# Patient Record
Sex: Male | Born: 1990 | Race: White | Hispanic: No | Marital: Married | Smoking: Former smoker
Health system: Southern US, Community
[De-identification: ages and names within clinical notes are randomized; demographics above are authoritative.]

## PROBLEM LIST (undated history)

## (undated) DIAGNOSIS — J342 Deviated nasal septum: Secondary | ICD-10-CM

## (undated) DIAGNOSIS — J343 Hypertrophy of nasal turbinates: Secondary | ICD-10-CM

## (undated) DIAGNOSIS — Z87828 Personal history of other (healed) physical injury and trauma: Secondary | ICD-10-CM

## (undated) DIAGNOSIS — R51 Headache: Secondary | ICD-10-CM

## (undated) DIAGNOSIS — F909 Attention-deficit hyperactivity disorder, unspecified type: Secondary | ICD-10-CM

## (undated) DIAGNOSIS — R519 Headache, unspecified: Secondary | ICD-10-CM

## (undated) DIAGNOSIS — F1911 Other psychoactive substance abuse, in remission: Secondary | ICD-10-CM

## (undated) DIAGNOSIS — F40240 Claustrophobia: Secondary | ICD-10-CM

## (undated) HISTORY — PX: INGUINAL HERNIA REPAIR: SHX194

## (undated) HISTORY — DX: Other psychoactive substance abuse, in remission: F19.11

## (undated) HISTORY — PX: TYMPANOSTOMY TUBE PLACEMENT: SHX32

## (undated) HISTORY — PX: TONSILLECTOMY AND ADENOIDECTOMY: SHX28

---
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
## 2013-05-30 ENCOUNTER — Ambulatory Visit (INDEPENDENT_AMBULATORY_CARE_PROVIDER_SITE_OTHER): Payer: BC Managed Care – PPO | Admitting: Internal Medicine

## 2013-05-30 ENCOUNTER — Encounter: Payer: Self-pay | Admitting: Internal Medicine

## 2013-05-30 DIAGNOSIS — M4200 Juvenile osteochondrosis of spine, site unspecified: Secondary | ICD-10-CM

## 2013-05-30 DIAGNOSIS — Z0000 Encounter for general adult medical examination without abnormal findings: Secondary | ICD-10-CM

## 2013-05-30 LAB — LIPID PANEL
Cholesterol: 136 mg/dL (ref 0–200)
Total CHOL/HDL Ratio: 4
Triglycerides: 78 mg/dL (ref 0.0–149.0)
VLDL: 15.6 mg/dL (ref 0.0–40.0)

## 2013-05-30 LAB — CBC WITH DIFFERENTIAL/PLATELET
Basophils Absolute: 0 10*3/uL (ref 0.0–0.1)
Basophils Relative: 0.5 % (ref 0.0–3.0)
Eosinophils Relative: 0.4 % (ref 0.0–5.0)
Lymphs Abs: 1.7 10*3/uL (ref 0.7–4.0)
Monocytes Relative: 7.7 % (ref 3.0–12.0)
Neutro Abs: 5.6 10*3/uL (ref 1.4–7.7)
Neutrophils Relative %: 69.6 % (ref 43.0–77.0)
RBC: 5.2 Mil/uL (ref 4.22–5.81)
WBC: 8 10*3/uL (ref 4.5–10.5)

## 2013-05-30 LAB — COMPREHENSIVE METABOLIC PANEL
AST: 786 U/L — ABNORMAL HIGH (ref 0–37)
GFR: 105.93 mL/min (ref >60.00)
Sodium: 139 mEq/L (ref 135–145)
Total Protein: 7 g/dL (ref 6.0–8.3)

## 2013-05-30 NOTE — Patient Instructions (Addendum)
It is important that you exercise regularly, at least 20 minutes 3 to 4 times per week.  If you develop chest pain or shortness of breath seek  medical attention.  You need to lose weight.  Consider a lower calorie diet and regular exercise.DASH Diet The DASH diet stands for "Dietary Approaches to Stop Hypertension." It is a healthy eating plan that has been shown to reduce high blood pressure (hypertension) in as little as 14 days, while also possibly providing other significant health benefits. These other health benefits include reducing the risk of breast cancer after menopause and reducing the risk of type 2 diabetes, heart disease, colon cancer, and stroke. Health benefits also include weight loss and slowing kidney failure in patients with chronic kidney disease.  DIET GUIDELINES  Limit salt (sodium). Your diet should contain less than 1500 mg of sodium daily.  Limit refined or processed carbohydrates. Your diet should include mostly whole grains. Desserts and added sugars should be used sparingly.  Include small amounts of heart-healthy fats. These types of fats include nuts, oils, and tub margarine. Limit saturated and trans fats. These fats have been shown to be harmful in the body. CHOOSING FOODS  The following food groups are based on a 2000 calorie diet. See your Registered Dietitian for individual calorie needs. Grains and Grain Products (6 to 8 servings daily)  Eat More Often: Whole-wheat bread, brown rice, whole-grain or wheat pasta, quinoa, popcorn without added fat or salt (air popped).  Eat Less Often: White bread, white pasta, white rice, cornbread. Vegetables (4 to 5 servings daily)  Eat More Often: Fresh, frozen, and canned vegetables. Vegetables may be raw, steamed, roasted, or grilled with a minimal amount of fat.  Eat Less Often/Avoid: Creamed or fried vegetables. Vegetables in a cheese sauce. Fruit (4 to 5 servings daily)  Eat More Often: All fresh, canned (in  natural juice), or frozen fruits. Dried fruits without added sugar. One hundred percent fruit juice ( cup [237 mL] daily).  Eat Less Often: Dried fruits with added sugar. Canned fruit in light or heavy syrup. [employer], Fish, and Poultry (2 servings or less daily. One serving is 3 to 4 oz [85-114 g]).  Eat More Often: Ninety percent or leaner ground beef, tenderloin, sirloin. Round cuts of beef, chicken breast, [place] breast. All fish. Grill, bake, or broil your meat. Nothing should be fried.  Eat Less Often/Avoid: Fatty cuts of meat, [place], or chicken leg, thigh, or wing. Fried cuts of meat or fish. Dairy (2 to 3 servings)  Eat More Often: Low-fat or fat-free milk, low-fat plain or light yogurt, reduced-fat or part-skim cheese.  Eat Less Often/Avoid: Milk (whole, 2%).Whole milk yogurt. Full-fat cheeses. Nuts, Seeds, and Legumes (4 to 5 servings per week)  Eat More Often: All without added salt.  Eat Less Often/Avoid: Salted nuts and seeds, canned beans with added salt. Fats and Sweets (limited)  Eat More Often: Vegetable oils, tub margarines without trans fats, sugar-free gelatin. Mayonnaise and salad dressings.  Eat Less Often/Avoid: Coconut oils, palm oils, butter, stick margarine, cream, half and half, cookies, candy, pie. FOR MORE INFORMATION The Dash Diet Eating Plan: www.dashdiet.org Document Released: 10/06/2011 Document Revised: 01/09/2012 Document Reviewed: 10/06/2011 [facility] Patient Information 2014 [place], [place]. Back Exercises Back exercises help treat and prevent back injuries. The goal of back exercises is to increase the strength of your abdominal and back muscles and the flexibility of your back. These exercises should be started when you no longer have back  pain. Back exercises include:  Pelvic Tilt. Lie on your back with your knees bent. Tilt your pelvis until the lower part of your back is against the floor. Hold this position 5 to 10 sec and repeat 5 to 10  times.  Knee to Chest. Pull first 1 knee up against your chest and hold for 20 to 30 seconds, repeat this with the other knee, and then both knees. This may be done with the other leg straight or bent, whichever feels better.  Sit-Ups or Curl-Ups. Bend your knees 90 degrees. Start with tilting your pelvis, and do a partial, slow sit-up, lifting your trunk only 30 to 45 degrees off the floor. Take at least 2 to 3 seconds for each sit-up. Do not do sit-ups with your knees out straight. If partial sit-ups are difficult, simply do the above but with only tightening your abdominal muscles and holding it as directed.  Hip-Lift. Lie on your back with your knees flexed 90 degrees. Push down with your feet and shoulders as you raise your hips a couple inches off the floor; hold for 10 seconds, repeat 5 to 10 times.  Back arches. Lie on your stomach, propping yourself up on bent elbows. Slowly press on your hands, causing an arch in your low back. Repeat 3 to 5 times. Any initial stiffness and discomfort should lessen with repetition over time.  Shoulder-Lifts. Lie face down with arms beside your body. Keep hips and torso pressed to floor as you slowly lift your head and shoulders off the floor. Do not overdo your exercises, especially in the beginning. Exercises may cause you some mild back discomfort which lasts for a few minutes; however, if the pain is more severe, or lasts for more than 15 minutes, do not continue exercises until you see your caregiver. Improvement with exercise therapy for back problems is slow.  See your caregivers for assistance with developing a proper back exercise program. Document Released: 11/24/2004 Document Revised: 01/09/2012 Document Reviewed: 08/18/2011 [facility] Patient Information 2014 [place], [place]. Back Injury Prevention Back injuries can be extremely painful and difficult to heal. After having one back injury, you are much more likely to experience another later on. It  is important to learn how to avoid injuring or re-injuring your back. The following tips can help you to prevent a back injury. PHYSICAL FITNESS  Exercise regularly and try to develop good tone in your abdominal muscles. Your abdominal muscles provide a lot of the support needed by your back.  Do aerobic exercises (walking, jogging, biking, swimming) regularly.  Do exercises that increase balance and strength (tai chi, yoga) regularly. This can decrease your risk of falling and injuring your back.  Stretch before and after exercising.  Maintain a healthy weight. The more you weigh, the more stress is placed on your back. For every pound of weight, 10 times that amount of pressure is placed on the back. DIET  Talk to your caregiver about how much calcium and vitamin D you need per day. These nutrients help to prevent weakening of the bones (osteoporosis). Osteoporosis can cause broken (fractured) bones that lead to back pain.  Include good sources of calcium in your diet, such as dairy products, green, leafy vegetables, and products with calcium added (fortified).  Include good sources of vitamin D in your diet, such as milk and foods that are fortified with vitamin D.  Consider taking a nutritional supplement or a multivitamin if needed.  Stop smoking if you smoke. POSTURE  Sit and stand up straight. Avoid leaning forward when you sit or hunching over when you stand.  Choose chairs with good low back (lumbar) support.  If you work at a desk, sit close to your work so you do not need to lean over. Keep your chin tucked in. Keep your neck drawn back and elbows bent at a right angle. Your arms should look like the letter "L."  Sit high and close to the steering wheel when you drive. Add a lumbar support to your car seat if needed.  Avoid sitting or standing in one position for too long. Take breaks to get up, stretch, and walk around at least once every hour. Take breaks if you are  driving for long periods of time.  Sleep on your side with your knees slightly bent, or sleep on your back with a pillow under your knees. Do not sleep on your stomach. LIFTING, TWISTING, AND REACHING  Avoid heavy lifting, especially repetitive lifting. If you must do heavy lifting:  Stretch before lifting.  Work slowly.  Rest between lifts.  Use carts and dollies to move objects when possible.  Make several small trips instead of carrying 1 heavy load.  Ask for help when you need it.  Ask for help when moving big, awkward objects.  Follow these steps when lifting:  Stand with your feet shoulder-width apart.  Get as close to the object as you can. Do not try to pick up heavy objects that are far from your body.  Use handles or lifting straps if they are available.  Bend at your knees. Squat down, but keep your heels off the floor.  Keep your shoulders pulled back, your chin tucked in, and your back straight.  Lift the object slowly, tightening the muscles in your legs, abdomen, and buttocks. Keep the object as close to the center of your body as possible.  When you put a load down, use these same guidelines in reverse.  Do not:  Lift the object above your waist.  Twist at the waist while lifting or carrying a load. Move your feet if you need to turn, not your waist.  Bend over without bending at your knees.  Avoid reaching over your head, across a table, or for an object on a high surface. OTHER TIPS  Avoid wet floors and keep sidewalks clear of ice to prevent falls.  Do not sleep on a mattress that is too soft or too hard.  Keep items that are used frequently within easy reach.  Put heavier objects on shelves at waist level and lighter objects on lower or higher shelves.  Find ways to decrease your stress, such as exercise, massage, or relaxation techniques. Stress can build up in your muscles. Tense muscles are more vulnerable to injury.  Seek treatment for  depression or anxiety if needed. These conditions can increase your risk of developing back pain. SEEK MEDICAL CARE IF:  You injure your back.  You have questions about diet, exercise, or other ways to prevent back injuries. MAKE SURE YOU:  Understand these instructions.  Will watch your condition.  Will get help right away if you are not doing well or get worse. Document Released: 11/24/2004 Document Revised: 01/09/2012 Document Reviewed: 11/28/2011 [facility] Patient Information 2014 [place], [place].  Smoking tobacco is very bad for your health. You should stop smoking immediately.

## 2013-05-30 NOTE — Progress Notes (Signed)
Subjective:    Patient ID: Ricardo Curry, male    DOB: 26-Oct-1991, 22 y.o.   MRN: [id number]  HPI  22 year old patient who is seen today to establish with our practice. He enjoys excellent health. Past medical history is remarkable for a hospitalization in 2007 for a left frontal epidural hematoma that was treated conservatively. Surgical history also includes a hernia repair in 1995 and a tonsillectomy in 1998. Past medical history is remarkable also for a history of ADHD exogenous obesity as well as Scheuermann's disease (juvenile epiphysitis)  Social history resident of medicine smokes a 2 or 3 cigarettes per day. 1-1/2 years of college. Presently works as a Estate agent. Single  Family history both parents have diabetes. Father age 62 status post cholecystectomy. Mother age 63 fibromyalgia. One brother is well. 3/2 sisters are well 1 I plan a heart attack at age 36. One grandfather status post bypass surgery date 69    Review of Systems  Constitutional: Negative for fever, chills, activity change, appetite change and fatigue.  HENT: Negative for hearing loss, ear pain, congestion, rhinorrhea, sneezing, mouth sores, trouble swallowing, neck pain, neck stiffness, dental problem, voice change, sinus pressure and tinnitus.   Eyes: Negative for photophobia, pain, redness and visual disturbance.  Respiratory: Negative for apnea, cough, choking, chest tightness, shortness of breath and wheezing.   Cardiovascular: Negative for chest pain, palpitations and leg swelling.  Gastrointestinal: Negative for nausea, vomiting, abdominal pain, diarrhea, constipation, blood in stool, abdominal distention, anal bleeding and rectal pain.  Genitourinary: Negative for dysuria, urgency, frequency, hematuria, flank pain, decreased urine volume, discharge, penile swelling, scrotal swelling, difficulty urinating, genital sores and testicular pain.  Musculoskeletal: Positive for back pain. Negative for  myalgias, joint swelling, arthralgias and gait problem.  Skin: Negative for color change, rash and wound.  Neurological: Negative for dizziness, tremors, seizures, syncope, facial asymmetry, speech difficulty, weakness, light-headedness, numbness and headaches.  Hematological: Negative for adenopathy. Does not bruise/bleed easily.  Psychiatric/Behavioral: Negative for suicidal ideas, hallucinations, behavioral problems, confusion, sleep disturbance, self-injury, dysphoric mood, decreased concentration and agitation. The patient is not nervous/anxious.        Objective:   Physical Exam  Constitutional: He appears well-developed and well-nourished.  HENT:  Head: Normocephalic and atraumatic.  Right Ear: External ear normal.  Left Ear: External ear normal.  Nose: Nose normal.  Mouth/Throat: Oropharynx is clear and moist.  Eyes: Conjunctivae and EOM are normal. Pupils are equal, round, and reactive to light. No scleral icterus.  Neck: Normal range of motion. Neck supple. No JVD present. No thyromegaly present.  Cardiovascular: Regular rhythm, normal heart sounds and intact distal pulses.  Exam reveals no gallop and no friction rub.   No murmur heard. Pulmonary/Chest: Effort normal and breath sounds normal. He exhibits no tenderness.  Abdominal: Soft. Bowel sounds are normal. He exhibits no distension and no mass. There is no tenderness.  Genitourinary: Prostate normal and penis normal.  Musculoskeletal: Normal range of motion. He exhibits no edema and no tenderness.  Lymphadenopathy:    He has no cervical adenopathy.  Neurological: He is alert. He has normal reflexes. No cranial nerve deficit. Coordination normal.  Skin: Skin is warm and dry. No rash noted.  Psychiatric: He has a normal mood and affect. His behavior is normal.          Assessment & Plan:   Preventive health exam Exogenous obesity Scheuermann's disease  Weight loss exercise regimen encouraged. Back exercises and  strengthening information dispensed Laboratory update  will be reviewed

## 2014-06-02 ENCOUNTER — Emergency Department (HOSPITAL_COMMUNITY)
Admission: EM | Admit: 2014-06-02 | Discharge: 2014-06-02 | Disposition: A | Discharge status: Home / Self Care | Payer: BC Managed Care – PPO | Attending: Emergency Medicine | Admitting: Emergency Medicine

## 2014-06-02 ENCOUNTER — Encounter (HOSPITAL_COMMUNITY): Payer: Self-pay | Admitting: Emergency Medicine

## 2014-06-02 DIAGNOSIS — Z8619 Personal history of other infectious and parasitic diseases: Secondary | ICD-10-CM | POA: Insufficient documentation

## 2014-06-02 DIAGNOSIS — Z87820 Personal history of traumatic brain injury: Secondary | ICD-10-CM | POA: Insufficient documentation

## 2014-06-02 DIAGNOSIS — F1920 Other psychoactive substance dependence, uncomplicated: Secondary | ICD-10-CM | POA: Insufficient documentation

## 2014-06-02 DIAGNOSIS — F1120 Opioid dependence, uncomplicated: Secondary | ICD-10-CM

## 2014-06-02 DIAGNOSIS — F17200 Nicotine dependence, unspecified, uncomplicated: Secondary | ICD-10-CM | POA: Insufficient documentation

## 2014-06-02 MED ORDER — LOPERAMIDE HCL 2 MG PO CAPS: 2.0000 mg | ORAL_CAPSULE | Freq: Four times a day (QID) | ORAL | Status: DC | PRN

## 2014-06-02 NOTE — Discharge Instructions (Signed)
Read the information below.  Use the prescribed medication as directed.  Please discuss all new medications with your pharmacist.  You may return to the Emergency Department at any time for worsening condition or any new symptoms that concern you.  If you develop uncontrolled pain, uncontrolled vomiting, or are unable to tolerate fluids by mouth, return to the ER for a recheck.     Opioid Use Disorder Opioid use disorder is a mental disorder. It is the continued nonmedical use of opioids in spite of risks to health and well-being. Misused opioids include the street drug heroin. They also include pain medicines such as morphine, hydrocodone, oxycodone, and fentanyl. Opioids are very addictive. People who misuse opioids get an exaggerated feeling of well-being. Opioid use disorder often disrupts activities at home, work, or school. It may cause mental or physical problems.  A family history of opioid use disorder puts you at higher risk of it. People with opioid use disorder often misuse other drugs or have mental illness such as depression, posttraumatic stress disorder, or antisocial personality disorder. They also are at risk of suicide and death from overdose. SIGNS AND SYMPTOMS  Signs and symptoms of opioid use disorder include:  Use of opioids in larger amounts or over a longer period than intended.  Unsuccessful attempts to cut down or control opioid use.  A lot of time spent obtaining, using, or recovering from the effects of opioids.  A strong desire or urge to use opioids (craving).  Continued use of opioids in spite of major problems at work, school, or home because of use.  Continued use of opioids in spite of relationship problems because of use.  Giving up or cutting down on important life activities because of opioid use.  Use of opioids over and over in situations when it is physically hazardous, such as driving a car.  Continued use of opioids in spite of a physical problem  that is likely related to use. Physical problems can include:  Severe constipation.  Poor nutrition.  Infertility.  Tuberculosis.  Aspiration pneumonia.  Infections such as human immunodeficiency virus (HIV) and hepatitis (from injecting opioids).  Continued use of opioids in spite of a mental problem that is likely related to use. Mental problems can include:  Depression.  Anxiety.  Hallucinations.  Sleep problems.  Loss of sexual function.  Need to use more and more opioids to get the same effect, or lessened effect over time with use of the same amount (tolerance).  Having withdrawal symptoms when opioid use is stopped, or using opioids to reduce or avoid withdrawal symptoms. Withdrawal symptoms include:  Depressed, anxious, or irritable mood.  Nausea, vomiting, diarrhea, or intestinal cramping.  Muscle aches or spasms.  Excessive tearing or runny nose.  Dilated pupils, sweating, or hairs standing on end.  Yawning.  Fever, raised blood pressure, or fast pulse.  Restlessness or trouble sleeping. This does not apply to people taking opioids for medical reasons only. DIAGNOSIS Opioid use disorder is diagnosed by your health care provider. You may be asked questions about your opioid use and and how it affects your life. A physical exam may be done. A drug screen may be ordered. You may be referred to a mental health professional. The diagnosis of opioid use disorder requires at least two symptoms within 12 months. The type of opioid use disorder you have depends on the number of signs and symptoms you have. The type may be:  Mild. Two or three signs and symptoms.  Moderate. Four or five signs and symptoms.   Severe. Six or more signs and symptoms. TREATMENT  Treatment is usually provided by mental health professionals with training in substance use disorders.The following options are available:  Detoxification.This is the first step in treatment for  withdrawal. It is medically supervised withdrawal with the use of medicines. These medicines lessen withdrawal symptoms. They also raise the chance of becoming opioid free.  Counseling, also known as talk therapy. Talk therapy addresses the reasons you use opioids. It also addresses ways to keep you from using again (relapse). The goals of talk therapy are to avoid relapse by:  Identifying and avoiding triggers for use.  Finding healthy ways to cope with stress.  Learning how to handle cravings.  Support groups. Support groups provide emotional support, advice, and guidance.  A medicine that blocks opioid receptors in your brain. This medicine can reduce opioid cravings that lead to relapse. This medicine also blocks the desired opioid effect when relapse occurs.  Opioids that are taken by mouth in place of the misused opioid (opioid maintenance treatment). These medicines satisfy cravings but are safer than commonly misused opioids. This often is the best option for people who continue to relapse with other treatments. HOME CARE INSTRUCTIONS   Take medicines only as directed by your health care provider.  Check with your health care provider before starting new medicines.  Keep all follow-up visits as directed by your health care provider. SEEK MEDICAL CARE IF:  You are not able to take your medicines as directed.  Your symptoms get worse. SEEK IMMEDIATE MEDICAL CARE IF:  You have serious thoughts about hurting yourself or others.  You may have taken an overdose of opioids. FOR MORE INFORMATION  National Institute on Drug Abuse: http://www.price-smith.com/www.drugabuse.gov  Substance Abuse and Mental Health Services Administration: SkateOasis.com.ptwww.samhsa.gov Document Released: 08/14/2007 Document Revised: 03/03/2014 Document Reviewed: 10/30/2013 [facility] Patient Information 2015 [place]. This information is not intended to replace advice given to you by your health care provider. Make sure you discuss any  questions you have with your health care provider.  Opioid Withdrawal Opioids are a group of narcotic drugs. They include the street drug heroin. They also include pain medicines, such as morphine, hydrocodone, oxycodone, and fentanyl. Opioid withdrawal is a group of characteristic physical and mental signs and symptoms. It typically occurs if you have been using opioids daily for several weeks or longer and stop using or rapidly decrease use. Opioid withdrawal can also occur if you have used opioids daily for a long time and are given a medicine to block the effect.  SIGNS AND SYMPTOMS Opioid withdrawal includes three or more of the following symptoms:   Depressed, anxious, or irritable mood.  Nausea or vomiting.  Muscle aches or spasms.   Watery eyes.   Runny nose.  Dilated pupils, sweating, or hairs standing on end.  Diarrhea or intestinal cramping.  Yawning.   Fever.  Increased blood pressure.  Fast pulse.  Restlessness or trouble sleeping. These signs and symptoms occur within several hours of stopping or reducing short-acting opioids, such as heroin. They can occur within 3 days of stopping or reducing long-acting opioids, such as methadone. Withdrawal begins within minutes of receiving a drug that blocks the effects of opioids, such as naltrexone or naloxone. DIAGNOSIS  Opioid use disorder is diagnosed by your health care provider. You will be asked about your symptoms, drug and alcohol use, medical history, and use of medicines. A physical exam may be done.  Lab tests may be ordered. Your health care provider may have you see a mental health professional.  TREATMENT  The treatment for opioid withdrawal is usually provided by medical doctors with special training in substance use disorders (addiction specialists). The following medicines may be included in treatment:  Opioids given in place of the abused opioid. They turn on opioid receptors in the brain and lessen or  prevent withdrawal symptoms. They are gradually decreased (opioid substitution and taper).  Non-opioids that can lessen certain opioid withdrawal symptoms. They may be used alone or with opioid substitution and taper. Successful long-term recovery usually requires medicine, counseling, and group support. HOME CARE INSTRUCTIONS   Take medicines only as directed by your health care provider.  Check with your health care provider before starting new medicines.  Keep all follow-up visits as directed by your health care provider. SEEK MEDICAL CARE IF:  You are not able to take your medicines as directed.  Your symptoms get worse.  You relapse. SEEK IMMEDIATE MEDICAL CARE IF:  You have serious thoughts about hurting yourself or others.  You have a seizure.  You lose consciousness. Document Released: 10/20/2003 Document Revised: 03/03/2014 Document Reviewed: 10/30/2013 [facility] Patient Information 2015 [place], [place]. This information is not intended to replace advice given to you by your health care provider. Make sure you discuss any questions you have with your health care provider.

## 2014-06-02 NOTE — ED Notes (Addendum)
Pt requesting detox from opiates, last use yesterday. States they have [facility] to [facility] and pt should have bed by wed or thurs.

## 2014-06-02 NOTE — BHH Counselor (Signed)
[facility]-C. Writer called [facility] and spoke w/ Chrissie NoaWilliam in admissions. Chrissie NoaWilliam states there will be a bed available for pt either this wed or thurs. Chrissie NoaWilliam says he has spoken with pt's father. Chrissie NoaWilliam reports that if pt not admitted to ED, then pt doesn't need to bring labs for [facility]. Writer explained that ED doesn't have opiate detox protocol. Chrissie NoaWilliam asked if pt could be put on some of type of meds for detox. Writer called Trixie DredgeEmily West who will tell pt of writer's TC to Chrissie NoaWilliam. Pt is to be d/c home and to stay in contact with [facility] re: bed status.  Evette Cristalaroline Paige Myia Bergh, [place] Assessment Counselor

## 2014-06-02 NOTE — ED Notes (Signed)
Bed: [facility] Expected date:  Expected time:  Means of arrival:  Comments: 

## 2014-06-02 NOTE — ED Notes (Signed)
Pt reports "I am feeling pretty good right now". [facility] instructed to return to ED if he needs to. Pt walked to d/c with one prescrip.
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
## 2014-06-03 NOTE — ED Provider Notes (Signed)
Medical screening examination/treatment/procedure(s) were performed by non-physician practitioner and as supervising physician I was immediately available for consultation/collaboration.   EKG Interpretation None        Shanna CiscoMegan E Docherty, MD 06/03/14 [phone]

## 2014-11-20 ENCOUNTER — Encounter: Payer: Self-pay | Admitting: Podiatrist

## 2014-11-20 ENCOUNTER — Ambulatory Visit (INDEPENDENT_AMBULATORY_CARE_PROVIDER_SITE_OTHER): Payer: BLUE CROSS/BLUE SHIELD | Admitting: Podiatrist

## 2014-11-20 VITALS — BP 142/97 | HR 103 | Resp 16

## 2014-11-20 DIAGNOSIS — B351 Tinea unguium: Secondary | ICD-10-CM

## 2014-11-20 MED ORDER — EFINACONAZOLE 10 % EX SOLN: 1.0000 [drp] | Freq: Every day | CUTANEOUS | Status: DC

## 2014-11-20 NOTE — Progress Notes (Signed)
   Subjective:    Patient ID: Ricardo Curry, male    DOB: 1990-12-13, 24 y.o.   MRN: [id number]  HPI Comments: "I have a toe I wanted checked"  Patient c/o aching 1st toe left for a few months. He ripped the nail off and then ran over it with the forklift at work. The nail is now discolored and thick. He did take a round of antibiotics initially.  Toe Pain       Review of Systems  Skin:       Change in nail   All other systems reviewed and are negative.      Objective:   Physical Exam Patient is awake, alert, and oriented x 3.  In no acute distress.  Vascular status is intact with palpable pedal pulses at 2/4 DP and PT bilateral and capillary refill time within normal limits. Neurological sensation is also intact bilaterally via Semmes Weinstein monofilament at 5/5 sites. Light touch, vibratory sensation, Achilles tendon reflex is intact. Dermatological exam reveals skin color, turger and texture as normal. No open lesions present.  Musculature intact with dorsiflexion, plantarflexion, inversion, eversion.  Left hallux nail is thick, discolored, dystrophic and mycotic in appearance. Subungual debris is also present.  Pain with direct pressure is also noted. No sign of ingrowing nail deformity is noted. No redness, swelling, drainage is noted.     Assessment & Plan:  Onychomycosis left hallux nail versus dystrophy secondary to a forklift injury in the past  Plan: Recommended taking a sample of the nail to identify a pathogen if there is one present. Discussed that this could be from an injury to the nailbed itself. Discussed oral versus topical therapies versus removal of the nail itself. We'll wait on the culture report before deciding on treatment. We'll notify him of the reculture is to becomes available.

## 2014-11-20 NOTE — Patient Instructions (Signed)
onychomycosis Nail A fungal infection of the nail (tinea unguium/onychomycosis) is common. It is common as the visible part of the nail is composed of dead cells which have no blood supply to help prevent infection. It occurs because fungi are everywhere and will pick any opportunity to grow on any dead material. Because nails are very slow growing they require up to 2 years of treatment with anti-fungal medications. The entire nail back to the base is infected. This includes approximately  of the nail which you cannot see. If your caregiver has prescribed a medication by mouth, take it every day and as directed. No progress will be seen for at least 6 to 9 months. Do not be disappointed! Because fungi live on dead cells with little or no exposure to blood supply, medication delivery to the infection is slow; thus the cure is slow. It is also why you can observe no progress in the first 6 months. The nail becoming cured is the base of the nail, as it has the blood supply. Topical medication such as creams and ointments are usually not effective. Important in successful treatment of nail fungus is closely following the medication regimen that your doctor prescribes. Sometimes you and your caregiver may elect to speed up this process by surgical removal of all the nails. Even this may still require 6 to 9 months of additional oral medications. See your caregiver as directed. Remember there will be no visible improvement for at least 6 months. See your caregiver sooner if other signs of infection (redness and swelling) develop. Document Released: 10/14/2000 Document Revised: 01/09/2012 Document Reviewed: 12/23/2008 ExitCare Patient Information 2015 [employer]. This information is not intended to replace advice given to you by your health care provider. Make sure you discuss any questions you have with your health care provider.  

## 2014-12-15 ENCOUNTER — Encounter: Payer: Self-pay | Admitting: Podiatrist

## 2014-12-15 ENCOUNTER — Telehealth: Payer: Self-pay | Admitting: *Deleted

## 2014-12-15 NOTE — Telephone Encounter (Signed)
-----   Message from Delories HeinzKathryn P Egerton, DPM sent at 12/10/2014  5:29 PM EST ----- Regarding: toenail culture result Toenail culture is negative--it usually means the thickness is from a trauma but  we can try a round of oral lamisil if he wants to see if that will help as sometimes there is fungus present, but the lab test doesn't always detect it.  Or we can remove the toenail completely and permanently if it is bothersome.  Happy to do either or none.    Thanks!

## 2014-12-15 NOTE — Telephone Encounter (Signed)
I called and left the patient a message to call me back. 

## 2015-01-02 NOTE — Telephone Encounter (Signed)
I left another message for the patient to give me a call.

## 2015-05-08 ENCOUNTER — Ambulatory Visit (INDEPENDENT_AMBULATORY_CARE_PROVIDER_SITE_OTHER): Payer: BLUE CROSS/BLUE SHIELD | Admitting: Podiatry

## 2015-05-08 ENCOUNTER — Encounter: Payer: Self-pay | Admitting: Podiatry

## 2015-05-08 ENCOUNTER — Ambulatory Visit (INDEPENDENT_AMBULATORY_CARE_PROVIDER_SITE_OTHER): Payer: BLUE CROSS/BLUE SHIELD

## 2015-05-08 VITALS — BP 145/96 | HR 92 | Resp 12

## 2015-05-08 DIAGNOSIS — B351 Tinea unguium: Secondary | ICD-10-CM

## 2015-05-08 DIAGNOSIS — R52 Pain, unspecified: Secondary | ICD-10-CM

## 2015-05-08 DIAGNOSIS — M722 Plantar fascial fibromatosis: Secondary | ICD-10-CM

## 2015-05-08 MED ORDER — DICLOFENAC SODIUM 75 MG PO TBEC: 75.0000 mg | DELAYED_RELEASE_TABLET | Freq: Two times a day (BID) | ORAL | Status: DC

## 2015-05-08 MED ORDER — TRIAMCINOLONE ACETONIDE 10 MG/ML IJ SUSP
10.0000 mg | Freq: Once | INTRAMUSCULAR | Status: AC
  Administered 2015-05-08: 10 mg

## 2015-05-08 NOTE — Patient Instructions (Signed)

## 2015-05-08 NOTE — Progress Notes (Signed)
   Subjective:    Patient ID: Ricardo Curry, male    DOB: 09-29-1991, 24 y.o.   MRN: [id number]  HPI  PT STATED LT FOOT ARCH IS BEEN PAINFUL FOR 4 MONTHS. THE FOOT IS GETTING WORSE ESPECIALLY WHEN SITTING AND THEN STAND UP IT HURTS. TRIED ICE IT HELP SOME.  Review of Systems  Musculoskeletal: Positive for gait problem.       Objective:   Physical Exam        Assessment & Plan:

## 2015-05-09 NOTE — Progress Notes (Signed)
Subjective:     Patient ID: Ricardo Curry, male   DOB: 1991/02/10, 24 y.o.   MRN: [id number]  HPI patient states I been having a lot of pain in my mid arch left foot for the last 4 months. It's been getting gradually worse and is now to the point where it hard for me to work or walk   Review of Systems     Objective:   Physical Exam Neurovascular status intact muscle strength adequate range of motion within normal limits. Patient's noted to have exquisite discomfort in the mid arch region left with inflammation of the medial band with no indication of tear or other deformity    Assessment:     Inflammatory fasciitis left mid arch area moderate depression of the arch noted    Plan:     H&P and x-ray reviewed with patient. Injected the mid arch area 3 mg Kenalog 5 mg Xylocaine and instructed on physical therapy and dispensed fascial brace. Discussed orthotics and reappoint one week to review

## 2015-05-15 ENCOUNTER — Ambulatory Visit: Payer: BLUE CROSS/BLUE SHIELD | Admitting: Podiatry

## 2016-12-29 DIAGNOSIS — J310 Chronic rhinitis: Secondary | ICD-10-CM | POA: Insufficient documentation

## 2016-12-29 DIAGNOSIS — R430 Anosmia: Secondary | ICD-10-CM | POA: Insufficient documentation

## 2017-02-17 ENCOUNTER — Other Ambulatory Visit: Payer: Self-pay | Admitting: Otolaryngology

## 2017-02-17 DIAGNOSIS — J3489 Other specified disorders of nose and nasal sinuses: Secondary | ICD-10-CM

## 2017-02-17 DIAGNOSIS — R430 Anosmia: Secondary | ICD-10-CM

## 2017-02-17 DIAGNOSIS — J310 Chronic rhinitis: Secondary | ICD-10-CM

## 2017-05-29 ENCOUNTER — Ambulatory Visit: Payer: BLUE CROSS/BLUE SHIELD | Admitting: Podiatry

## 2017-06-02 DIAGNOSIS — F900 Attention-deficit hyperactivity disorder, predominantly inattentive type: Secondary | ICD-10-CM | POA: Diagnosis not present

## 2017-06-02 DIAGNOSIS — Z6841 Body Mass Index (BMI) 40.0 and over, adult: Secondary | ICD-10-CM | POA: Diagnosis not present

## 2017-08-31 DIAGNOSIS — J342 Deviated nasal septum: Secondary | ICD-10-CM

## 2017-08-31 DIAGNOSIS — J343 Hypertrophy of nasal turbinates: Secondary | ICD-10-CM

## 2017-08-31 HISTORY — DX: Hypertrophy of nasal turbinates: J34.3

## 2017-08-31 HISTORY — DX: Deviated nasal septum: J34.2

## 2017-09-08 NOTE — H&P (Signed)
HPI:   Ricardo RoyalsJoshua Curry is a 26 y.o. male who presents as a new Patient.   Referring Provider: Referral, Self  Chief complaint: Nose problems.  HPI: 2 year history of chronic nasal obstruction, congestion, difficulty with sense of smell. Prior to that he was using drugs including snorting cocaine. He has been clean for 2 years and has had these symptoms ever since. He has not tried any kind of medication. He does not smoke. Otherwise in pretty good health.  PMH/Meds/All/SocHx/FamHx/ROS:   History reviewed. No pertinent past medical history.  Past Surgical History:  Procedure Laterality Date  . TYMPANOSTOMY TUBE PLACEMENT   No family history of bleeding disorders, wound healing problems or difficulty with anesthesia.   Social History   Social History  . Marital status: Single  Spouse name: N/A  . Number of children: N/A  . Years of education: N/A   Occupational History  . Not on file.   Social History Main Topics  . Smoking status: Former Games developermoker  . Smokeless tobacco: Never Used  . Alcohol use Not on file  . Drug use: Unknown  . Sexual activity: Not on file   Other Topics Concern  . Not on file   Social History Narrative  . No narrative on file   Current Outpatient Prescriptions:  . fluticasone (FLONASE) 50 mcg/actuation nasal spray, 1 spray by Nasal route daily for 30 days., Disp: 1 Inhaler, Rfl: 11  A complete ROS was performed with pertinent positives/negatives noted in the HPI. The remainder of the ROS are negative.   Physical Exam:   There were no vitals taken for this visit.  General: Healthy and alert, in no distress, breathing easily. Normal affect. In a pleasant mood. Head: Normocephalic, atraumatic. No masses, or scars. Eyes: Pupils are equal, and reactive to light. Vision is grossly intact. No spontaneous or gaze nystagmus. Ears: Ear canals are clear. Tympanic membranes are intact, with normal landmarks and the middle ears are clear and  healthy. Hearing: Grossly normal. Nose: There is diffuse mucosal edema throughout the nasal cavities including the septum and the turbinates. There is no evidence of polyps and no exudate identified. There is no septal perforation. Face: No masses or scars, facial nerve function is symmetric. Oral Cavity: No mucosal abnormalities are noted. Tongue with normal mobility. Dentition appears healthy. Oropharynx: Tonsils are symmetric. There are no mucosal masses identified. Tongue base appears normal and healthy. Larynx/Hypopharynx: deferred Chest: Deferred Neck: No palpable masses, no cervical adenopathy, no thyroid nodules or enlargement. Neuro: Cranial nerves II-XII will normal function. Balance: Normal gate. Other findings: none.  Independent Review of Additional Tests or Records:  none  Procedures:  none  Impression & Plans:  Severe chronic rhinitis. Recommend a four-week trial of antihistamine daily, and nasal steroid inhaler daily. Follow-up in 1 month to see if he has any response to this. If not, we may need to consider sinus imaging.

## 2017-09-12 ENCOUNTER — Encounter (HOSPITAL_BASED_OUTPATIENT_CLINIC_OR_DEPARTMENT_OTHER): Payer: Self-pay | Admitting: *Deleted

## 2017-09-12 ENCOUNTER — Other Ambulatory Visit: Payer: Self-pay

## 2017-09-18 ENCOUNTER — Ambulatory Visit (HOSPITAL_BASED_OUTPATIENT_CLINIC_OR_DEPARTMENT_OTHER): Payer: BLUE CROSS/BLUE SHIELD | Admitting: Anesthesiology

## 2017-09-18 ENCOUNTER — Encounter (HOSPITAL_BASED_OUTPATIENT_CLINIC_OR_DEPARTMENT_OTHER)
Admission: RE | Disposition: A | Discharge status: Home / Self Care | Payer: Self-pay | Source: Ambulatory Visit | Attending: Otolaryngology

## 2017-09-18 ENCOUNTER — Encounter (HOSPITAL_BASED_OUTPATIENT_CLINIC_OR_DEPARTMENT_OTHER): Payer: Self-pay | Admitting: *Deleted

## 2017-09-18 ENCOUNTER — Ambulatory Visit (HOSPITAL_BASED_OUTPATIENT_CLINIC_OR_DEPARTMENT_OTHER)
Admission: RE | Admit: 2017-09-18 | Discharge: 2017-09-18 | Disposition: A | Discharge status: Home / Self Care | Payer: BLUE CROSS/BLUE SHIELD | Source: Ambulatory Visit | Attending: Otolaryngology | Admitting: Otolaryngology

## 2017-09-18 ENCOUNTER — Other Ambulatory Visit: Payer: Self-pay

## 2017-09-18 DIAGNOSIS — Z7951 Long term (current) use of inhaled steroids: Secondary | ICD-10-CM | POA: Insufficient documentation

## 2017-09-18 DIAGNOSIS — F909 Attention-deficit hyperactivity disorder, unspecified type: Secondary | ICD-10-CM | POA: Diagnosis not present

## 2017-09-18 DIAGNOSIS — Z87891 Personal history of nicotine dependence: Secondary | ICD-10-CM | POA: Insufficient documentation

## 2017-09-18 DIAGNOSIS — J310 Chronic rhinitis: Secondary | ICD-10-CM | POA: Diagnosis not present

## 2017-09-18 DIAGNOSIS — J342 Deviated nasal septum: Secondary | ICD-10-CM | POA: Diagnosis not present

## 2017-09-18 DIAGNOSIS — F419 Anxiety disorder, unspecified: Secondary | ICD-10-CM | POA: Diagnosis not present

## 2017-09-18 DIAGNOSIS — J343 Hypertrophy of nasal turbinates: Secondary | ICD-10-CM | POA: Diagnosis not present

## 2017-09-18 HISTORY — DX: Attention-deficit hyperactivity disorder, unspecified type: F90.9

## 2017-09-18 HISTORY — DX: Hypertrophy of nasal turbinates: J34.3

## 2017-09-18 HISTORY — DX: Claustrophobia: F40.240

## 2017-09-18 HISTORY — PX: NASAL SEPTOPLASTY W/ TURBINOPLASTY: SHX2070

## 2017-09-18 HISTORY — DX: Headache, unspecified: R51.9

## 2017-09-18 HISTORY — DX: Headache: R51

## 2017-09-18 HISTORY — DX: Personal history of other (healed) physical injury and trauma: Z87.828

## 2017-09-18 HISTORY — DX: Deviated nasal septum: J34.2

## 2017-09-18 SURGERY — SEPTOPLASTY, NOSE, WITH NASAL TURBINATE REDUCTION
Anesthesia: General | Site: Nose | Laterality: Bilateral

## 2017-09-18 MED ORDER — MIDAZOLAM HCL 2 MG/2ML IJ SOLN
INTRAMUSCULAR | Status: AC
  Filled 2017-09-18: qty 2

## 2017-09-18 MED ORDER — DEXAMETHASONE SODIUM PHOSPHATE 10 MG/ML IJ SOLN
INTRAMUSCULAR | Status: DC | PRN
  Administered 2017-09-18: 10 mg via INTRAVENOUS

## 2017-09-18 MED ORDER — PROMETHAZINE HCL 25 MG RE SUPP: 25.0000 mg | Freq: Four times a day (QID) | RECTAL | 1 refills | Status: DC | PRN

## 2017-09-18 MED ORDER — FENTANYL CITRATE (PF) 100 MCG/2ML IJ SOLN
INTRAMUSCULAR | Status: AC
  Filled 2017-09-18: qty 2

## 2017-09-18 MED ORDER — LIDOCAINE-EPINEPHRINE 1 %-1:100000 IJ SOLN
INTRAMUSCULAR | Status: AC
  Filled 2017-09-18: qty 1

## 2017-09-18 MED ORDER — DEXAMETHASONE SODIUM PHOSPHATE 10 MG/ML IJ SOLN
INTRAMUSCULAR | Status: AC
  Filled 2017-09-18: qty 1

## 2017-09-18 MED ORDER — ONDANSETRON HCL 4 MG/2ML IJ SOLN
INTRAMUSCULAR | Status: AC
  Filled 2017-09-18: qty 2

## 2017-09-18 MED ORDER — ACETAMINOPHEN 500 MG PO TABS
1000.0000 mg | ORAL_TABLET | Freq: Once | ORAL | Status: AC
  Administered 2017-09-18: 1000 mg via ORAL

## 2017-09-18 MED ORDER — PROPOFOL 10 MG/ML IV BOLUS
INTRAVENOUS | Status: DC | PRN
  Administered 2017-09-18: 200 mg via INTRAVENOUS
  Administered 2017-09-18: 70 mg via INTRAVENOUS

## 2017-09-18 MED ORDER — OXYMETAZOLINE HCL 0.05 % NA SOLN
NASAL | Status: DC | PRN
  Administered 2017-09-18: 1 via TOPICAL

## 2017-09-18 MED ORDER — LIDOCAINE-EPINEPHRINE 1 %-1:100000 IJ SOLN
INTRAMUSCULAR | Status: DC | PRN
  Administered 2017-09-18: 5.5 mL

## 2017-09-18 MED ORDER — MIDAZOLAM HCL 2 MG/2ML IJ SOLN: 1.0000 mg | INTRAMUSCULAR | Status: DC | PRN

## 2017-09-18 MED ORDER — FENTANYL CITRATE (PF) 100 MCG/2ML IJ SOLN: 50.0000 ug | INTRAMUSCULAR | Status: DC | PRN

## 2017-09-18 MED ORDER — SUGAMMADEX SODIUM 200 MG/2ML IV SOLN
INTRAVENOUS | Status: AC
  Filled 2017-09-18: qty 2

## 2017-09-18 MED ORDER — SCOPOLAMINE 1 MG/3DAYS TD PT72: 1.0000 | MEDICATED_PATCH | Freq: Once | TRANSDERMAL | Status: DC | PRN

## 2017-09-18 MED ORDER — OXYMETAZOLINE HCL 0.05 % NA SOLN
NASAL | Status: AC
  Filled 2017-09-18: qty 15

## 2017-09-18 MED ORDER — ROCURONIUM BROMIDE 10 MG/ML (PF) SYRINGE
PREFILLED_SYRINGE | INTRAVENOUS | Status: AC
  Filled 2017-09-18: qty 5

## 2017-09-18 MED ORDER — MUPIROCIN 2 % EX OINT
TOPICAL_OINTMENT | CUTANEOUS | Status: AC
  Filled 2017-09-18: qty 22

## 2017-09-18 MED ORDER — ACETAMINOPHEN 500 MG PO TABS
ORAL_TABLET | ORAL | Status: AC
  Filled 2017-09-18: qty 2

## 2017-09-18 MED ORDER — LACTATED RINGERS IV SOLN
INTRAVENOUS | Status: DC
  Administered 2017-09-18 (×2): via INTRAVENOUS

## 2017-09-18 MED ORDER — FENTANYL CITRATE (PF) 100 MCG/2ML IJ SOLN
INTRAMUSCULAR | Status: DC | PRN
  Administered 2017-09-18: 100 ug via INTRAVENOUS

## 2017-09-18 MED ORDER — LIDOCAINE 2% (20 MG/ML) 5 ML SYRINGE
INTRAMUSCULAR | Status: DC | PRN
  Administered 2017-09-18: 100 mg via INTRAVENOUS

## 2017-09-18 MED ORDER — BACITRACIN ZINC 500 UNIT/GM EX OINT
TOPICAL_OINTMENT | CUTANEOUS | Status: DC | PRN
  Administered 2017-09-18: 1 via TOPICAL

## 2017-09-18 MED ORDER — ONDANSETRON HCL 4 MG/2ML IJ SOLN
INTRAMUSCULAR | Status: DC | PRN
  Administered 2017-09-18: 4 mg via INTRAVENOUS

## 2017-09-18 MED ORDER — MIDAZOLAM HCL 2 MG/2ML IJ SOLN
INTRAMUSCULAR | Status: DC | PRN
  Administered 2017-09-18: 2 mg via INTRAVENOUS

## 2017-09-18 MED ORDER — CEPHALEXIN 500 MG PO CAPS: 500.0000 mg | ORAL_CAPSULE | Freq: Three times a day (TID) | ORAL | 0 refills | Status: DC

## 2017-09-18 MED ORDER — BACITRACIN ZINC 500 UNIT/GM EX OINT
TOPICAL_OINTMENT | CUTANEOUS | Status: AC
Start: 2017-09-18 — End: 2017-09-18
  Filled 2017-09-18: qty 28.35

## 2017-09-18 MED ORDER — PROPOFOL 10 MG/ML IV BOLUS
INTRAVENOUS | Status: AC
  Filled 2017-09-18: qty 20

## 2017-09-18 MED ORDER — FENTANYL CITRATE (PF) 100 MCG/2ML IJ SOLN
25.0000 ug | INTRAMUSCULAR | Status: DC | PRN
  Administered 2017-09-18 (×2): 50 ug via INTRAVENOUS

## 2017-09-18 MED ORDER — LIDOCAINE 2% (20 MG/ML) 5 ML SYRINGE
INTRAMUSCULAR | Status: AC
  Filled 2017-09-18: qty 5

## 2017-09-18 MED ORDER — PROMETHAZINE HCL 25 MG/ML IJ SOLN: 6.2500 mg | INTRAMUSCULAR | Status: DC | PRN

## 2017-09-18 MED ORDER — HYDROCODONE-ACETAMINOPHEN 7.5-325 MG PO TABS: 1.0000 | ORAL_TABLET | Freq: Four times a day (QID) | ORAL | 0 refills | Status: DC | PRN

## 2017-09-18 MED ORDER — OXYMETAZOLINE HCL 0.05 % NA SOLN
2.0000 | NASAL | Status: AC
  Administered 2017-09-18 (×3): 2 via NASAL

## 2017-09-18 MED ORDER — ROCURONIUM BROMIDE 50 MG/5ML IV SOSY
PREFILLED_SYRINGE | INTRAVENOUS | Status: DC | PRN
  Administered 2017-09-18: 50 mg via INTRAVENOUS

## 2017-09-18 SURGICAL SUPPLY — 38 items
ATTRACTOMAT 16X20 MAGNETIC DRP (DRAPES) IMPLANT
CANISTER SUCT 1200ML W/VALVE (MISCELLANEOUS) ×3 IMPLANT
CLOSURE WOUND 1/2 X4 (GAUZE/BANDAGES/DRESSINGS)
COAGULATOR SUCT 8FR VV (MISCELLANEOUS) IMPLANT
DECANTER SPIKE VIAL GLASS SM (MISCELLANEOUS) IMPLANT
DRSG NASOPORE 8CM (GAUZE/BANDAGES/DRESSINGS) IMPLANT
DRSG TELFA 3X8 NADH (GAUZE/BANDAGES/DRESSINGS) ×3 IMPLANT
ELECT REM PT RETURN 9FT ADLT (ELECTROSURGICAL) ×3
ELECTRODE REM PT RTRN 9FT ADLT (ELECTROSURGICAL) ×1 IMPLANT
GAUZE SPONGE 4X4 16PLY XRAY LF (GAUZE/BANDAGES/DRESSINGS) IMPLANT
GLOVE BIOGEL PI IND STRL 7.0 (GLOVE) IMPLANT
GLOVE BIOGEL PI INDICATOR 7.0 (GLOVE) ×2
GLOVE ECLIPSE 7.0 STRL STRAW (GLOVE) ×3 IMPLANT
GLOVE ECLIPSE 7.5 STRL STRAW (GLOVE) ×3 IMPLANT
GOWN STRL REUS W/ TWL LRG LVL3 (GOWN DISPOSABLE) ×1 IMPLANT
GOWN STRL REUS W/ TWL XL LVL3 (GOWN DISPOSABLE) IMPLANT
GOWN STRL REUS W/TWL LRG LVL3 (GOWN DISPOSABLE) ×3
GOWN STRL REUS W/TWL XL LVL3 (GOWN DISPOSABLE) ×3
HEMOSTAT SURGICEL .5X2 ABSORB (HEMOSTASIS) IMPLANT
NDL PRECISIONGLIDE 27X1.5 (NEEDLE) ×1 IMPLANT
NEEDLE PRECISIONGLIDE 27X1.5 (NEEDLE) ×3 IMPLANT
NS IRRIG 1000ML POUR BTL (IV SOLUTION) ×3 IMPLANT
PACK BASIN DAY SURGERY FS (CUSTOM PROCEDURE TRAY) ×3 IMPLANT
PACK ENT DAY SURGERY (CUSTOM PROCEDURE TRAY) ×3 IMPLANT
PAD DRESSING TELFA 3X8 NADH (GAUZE/BANDAGES/DRESSINGS) IMPLANT
PATTIES SURGICAL .5 X3 (DISPOSABLE) ×3 IMPLANT
SHEET SILASTIC 8X6X.030 25-30 (MISCELLANEOUS) IMPLANT
SLEEVE SCD COMPRESS KNEE MED (MISCELLANEOUS) ×3 IMPLANT
SPONGE GAUZE 2X2 8PLY STER LF (GAUZE/BANDAGES/DRESSINGS) ×1
SPONGE GAUZE 2X2 8PLY STRL LF (GAUZE/BANDAGES/DRESSINGS) ×2 IMPLANT
STRIP CLOSURE SKIN 1/2X4 (GAUZE/BANDAGES/DRESSINGS) IMPLANT
SUT CHROMIC 4 0 P 3 18 (SUTURE) ×3 IMPLANT
SUT ETHILON 3 0 PS 1 (SUTURE) IMPLANT
SUT ETHILON 4 0 CL P 3 (SUTURE) IMPLANT
SUT ETHILON 6 0 P 1 (SUTURE) IMPLANT
SUT PLAIN 4 0 ~~LOC~~ 1 (SUTURE) ×3 IMPLANT
TOWEL OR 17X24 6PK STRL BLUE (TOWEL DISPOSABLE) ×3 IMPLANT
YANKAUER SUCT BULB TIP NO VENT (SUCTIONS) ×3 IMPLANT

## 2017-09-18 NOTE — Discharge Instructions (Signed)
° °  Call your surgeon if you experience:   1.  Fever over 101.0. 2.  Inability to urinate. 3.  Nausea and/or vomiting. 4.  Continued bleeding from the incision. 5.  Problems related to your pain medication. 6.  Any problems and/or concerns   Post Anesthesia Home Care Instructions  Activity: Get plenty of rest for the remainder of the day. A responsible individual must stay with you for 24 hours following the procedure.  For the next 24 hours, DO NOT: -Drive a car -Advertising copywriterperate machinery -Drink alcoholic beverages -Take any medication unless instructed by your physician -Make any legal decisions or sign important papers.  Meals: Start with liquid foods such as gelatin or soup. Progress to regular foods as tolerated. Avoid greasy, spicy, heavy foods. If nausea and/or vomiting occur, drink only clear liquids until the nausea and/or vomiting subsides. Call your physician if vomiting continues.  Special Instructions/Symptoms: Your throat may feel dry or sore from the anesthesia or the breathing tube placed in your throat during surgery. If this causes discomfort, gargle with warm salt water. The discomfort should disappear within 24 hours.  If you had a scopolamine patch placed behind your ear for the management of post- operative nausea and/or vomiting:  1. The medication in the patch is effective for 72 hours, after which it should be removed.  Wrap patch in a tissue and discard in the trash. Wash hands thoroughly with soap and water. 2. You may remove the patch earlier than 72 hours if you experience unpleasant side effects which may include dry mouth, dizziness or visual disturbances. 3. Avoid touching the patch. Wash your hands with soap and water after contact with the patch.

## 2017-09-18 NOTE — Anesthesia Postprocedure Evaluation (Signed)
Anesthesia Post Note  Patient: Ricardo Curry  Procedure(s) Performed: NASAL SEPTOPLASTY WITH TURBINATE REDUCTION (Bilateral Nose)     Anesthesia Post Evaluation  Last Vitals:  Vitals:   09/18/17 0930 09/18/17 1008  BP: 120/74 140/90  Pulse: 82 85  Resp: 20 (!) 22  Temp:  37.1 C  SpO2: 99% 98%    Last Pain:  Vitals:   09/18/17 1008  TempSrc:   PainSc: 0-No pain                 Cecile HearingStephen Edward Marcina Kinnison

## 2017-09-18 NOTE — Interval H&P Note (Signed)
History and Physical Interval Note:  09/18/2017 7:22 AM  Ricardo Curry  has presented today for surgery, with the diagnosis of DEVIATED SEPTUM, NASAL TURBINATE HYPERTROPHY  The various methods of treatment have been discussed with the patient and family. After consideration of risks, benefits and other options for treatment, the patient has consented to  Procedure(s): NASAL SEPTOPLASTY WITH TURBINATE REDUCTION (N/A) as a surgical intervention .  The patient's history has been reviewed, patient examined, no change in status, stable for surgery.  I have reviewed the patient's chart and labs.  Questions were answered to the patient's satisfaction.     Serena ColonelJefry Tycho Cheramie

## 2017-09-18 NOTE — Transfer of Care (Signed)
Immediate Anesthesia Transfer of Care Note  Patient: Ricardo DraftJoshua R Keller  Procedure(s) Performed: NASAL SEPTOPLASTY WITH TURBINATE REDUCTION (Bilateral Nose)  Patient Location: PACU  Anesthesia Type:General  Level of Consciousness: awake, alert  and oriented  Airway & Oxygen Therapy: Patient Spontanous Breathing and Patient connected to face mask oxygen  Post-op Assessment: Report given to RN and Post -op Vital signs reviewed and stable  Post vital signs: Reviewed and stable  Last Vitals:  Vitals:   09/18/17 0649  BP: 127/85  Pulse: 88  Resp: 20  Temp: 36.6 C  SpO2: 99%    Last Pain:  Vitals:   09/18/17 0649  TempSrc: Oral         Complications: No apparent anesthesia complications

## 2017-09-18 NOTE — Op Note (Signed)
OPERATIVE REPORT  DATE OF SURGERY: 09/18/2017  PATIENT:  Ricardo Curry,  26 y.o. male  PRE-OPERATIVE DIAGNOSIS:  DEVIATED SEPTUM, NASAL TURBINATE HYPERTROPHY  POST-OPERATIVE DIAGNOSIS:  DEVIATED SEPTUM, NASAL TURBINATE HYPERTROPHY  PROCEDURE:  Procedure(s): NASAL SEPTOPLASTY WITH TURBINATE REDUCTION  SURGEON:  Susy FrizzleJefry H Taos Tapp, MD  ASSISTANTS: none  ANESTHESIA:   General   EBL: 30 ml  DRAINS: none  LOCAL MEDICATIONS USED:  1% Xylocaine with epinephrine  SPECIMEN:  none  COUNTS:  Correct  PROCEDURE DETAILS: The patient was taken to the operating room and placed on the operating table in the supine position. Following induction of general endotracheal anesthesia, the nose was prepped and draped in a standard fashion. Afrin spray was used preoperatively in the holding area. 1% Xylocaine with epinephrine was infiltrated into the septum, the columella, and the inferior turbinates bilaterally.  1. Nasal septoplasty. A left hemitransfixion incision was created with a 15 scalpel. A mucoperichondrial flap was developed posteriorly down the left side of the nasal septum using a Cottle elevator. This was performed all the way to the sphenoid rostrum. The bony cartilaginous junction was divided and a similar flap was developed down the right side. The ethmoid plate was thickened with bilateral spurs and was mostly resected.  There is also thickening of the maxillary crest causing spurring down the left side all the way down the anterior third of the nasal passageway, this was resected using an osteotome.  Part of the vomer was removed as well.  All of these measures allow the septum to lie more straight down the midline without any obstruction.  2. Submucous resection inferior turbinates bilaterally.  The inferior turbinates were severely enlarged bilaterally.  The leading edge of the inferior turbinates were incised in a vertical fashion with a #15 scalpel. The Cottle elevator was used to  elevate mucosa off bone in all directions. Fragments of the turbinate bone were resected with a Takahashi forceps. The turbinate remnants were outfractured with the Therapist, nutritionalreer elevator.  All of these maneuvers greatly increased the nasal airways bilaterally. The nasal cavities were packed with rolled up Telfa coated with bacitracin ointment. The pharynx was suctioned of blood and secretions under direct visualization. The patient was awakened extubated and transferred to recovery in stable condition.    PATIENT DISPOSITION:  To PACU, stable

## 2017-09-18 NOTE — Anesthesia Preprocedure Evaluation (Signed)
Anesthesia Evaluation  Patient identified by MRN, date of birth, ID band Patient awake    Reviewed: Allergy & Precautions, NPO status , Patient's Chart, lab work & pertinent test results  Airway Mallampati: II  TM Distance: >3 FB Neck ROM: Full    Dental  (+) Teeth Intact, Dental Advisory Given   Pulmonary Current Smoker,    Pulmonary exam normal breath sounds clear to auscultation       Cardiovascular negative cardio ROS Normal cardiovascular exam Rhythm:Regular Rate:Normal     Neuro/Psych  Headaches, PSYCHIATRIC DISORDERS Anxiety    GI/Hepatic negative GI ROS, Neg liver ROS,   Endo/Other  Morbid obesity  Renal/GU negative Renal ROS     Musculoskeletal negative musculoskeletal ROS (+)   Abdominal   Peds  (+) ADHD Hematology negative hematology ROS (+)   Anesthesia Other Findings Day of surgery medications reviewed with the patient.  Reproductive/Obstetrics                             Anesthesia Physical Anesthesia Plan  ASA: III  Anesthesia Plan: General   Post-op Pain Management:    Induction: Intravenous  PONV Risk Score and Plan: 2 and Dexamethasone, Ondansetron and Midazolam  Airway Management Planned: Oral ETT  Additional Equipment:   Intra-op Plan:   Post-operative Plan: Extubation in OR  Informed Consent: I have reviewed the patients History and Physical, chart, labs and discussed the procedure including the risks, benefits and alternatives for the proposed anesthesia with the patient or authorized representative who has indicated his/her understanding and acceptance.   Dental advisory given  Plan Discussed with: CRNA  Anesthesia Plan Comments: (Risks/benefits of general anesthesia discussed with patient including risk of damage to teeth, lips, gum, and tongue, nausea/vomiting, allergic reactions to medications, and the possibility of heart attack, stroke and  death.  All patient questions answered.  Patient wishes to proceed.)        Anesthesia Quick Evaluation

## 2017-09-18 NOTE — Anesthesia Procedure Notes (Signed)
Procedure Name: Intubation Date/Time: 09/18/2017 7:42 AM Performed by: Pearson Grippeobertson, Jaliza Seifried M, CRNA Pre-anesthesia Checklist: Patient identified, Emergency Drugs available, Suction available and Patient being monitored Patient Re-evaluated:Patient Re-evaluated prior to induction Oxygen Delivery Method: Circle system utilized Preoxygenation: Pre-oxygenation with 100% oxygen Induction Type: IV induction Ventilation: Mask ventilation without difficulty and Oral airway inserted - appropriate to patient size Laryngoscope Size: Hyacinth MeekerMiller and 2 Grade View: Grade II Tube type: Oral Rae Tube size: 8.0 mm Number of attempts: 1 Airway Equipment and Method: Stylet and Oral airway Placement Confirmation: ETT inserted through vocal cords under direct vision,  positive ETCO2 and breath sounds checked- equal and bilateral Secured at: 23 cm Tube secured with: Tape Dental Injury: Teeth and Oropharynx as per pre-operative assessment

## 2017-09-19 ENCOUNTER — Encounter (HOSPITAL_BASED_OUTPATIENT_CLINIC_OR_DEPARTMENT_OTHER): Payer: Self-pay | Admitting: Otolaryngology

## 2019-07-02 DIAGNOSIS — Z141 Cystic fibrosis carrier: Secondary | ICD-10-CM | POA: Diagnosis not present

## 2019-09-19 DIAGNOSIS — Z03818 Encounter for observation for suspected exposure to other biological agents ruled out: Secondary | ICD-10-CM | POA: Diagnosis not present

## 2019-11-04 ENCOUNTER — Other Ambulatory Visit: Payer: Self-pay

## 2019-11-05 ENCOUNTER — Ambulatory Visit: Payer: BLUE CROSS/BLUE SHIELD | Admitting: Family Medicine

## 2019-11-05 DIAGNOSIS — Z20828 Contact with and (suspected) exposure to other viral communicable diseases: Secondary | ICD-10-CM | POA: Diagnosis not present

## 2019-11-05 DIAGNOSIS — U071 COVID-19: Secondary | ICD-10-CM | POA: Diagnosis not present

## 2019-11-08 DIAGNOSIS — Z9189 Other specified personal risk factors, not elsewhere classified: Secondary | ICD-10-CM | POA: Diagnosis not present

## 2019-11-08 DIAGNOSIS — U071 COVID-19: Secondary | ICD-10-CM | POA: Diagnosis not present

## 2019-11-27 ENCOUNTER — Other Ambulatory Visit: Payer: Self-pay

## 2019-11-28 ENCOUNTER — Ambulatory Visit: Payer: BC Managed Care – PPO | Admitting: Family Medicine

## 2019-11-28 ENCOUNTER — Encounter: Payer: Self-pay | Admitting: Family Medicine

## 2019-11-28 VITALS — BP 110/65 | HR 90 | Temp 99.0°F | Resp 20 | Ht 74.0 in | Wt 342.0 lb

## 2019-11-28 DIAGNOSIS — Z23 Encounter for immunization: Secondary | ICD-10-CM

## 2019-11-28 DIAGNOSIS — Z0000 Encounter for general adult medical examination without abnormal findings: Secondary | ICD-10-CM | POA: Diagnosis not present

## 2019-11-28 NOTE — Patient Instructions (Signed)

## 2019-11-28 NOTE — Progress Notes (Signed)
Subjective:  Patient ID: Ricardo Curry, male    DOB: 12-Oct-1991, 29 y.o.   MRN: [id number]  Patient Care Team: Marletta Lor, MD as PCP - General (Internal Medicine)   Chief Complaint:  Establish Care (New =- DR Rickey Primus, [place])   HPI: Ricardo Curry is a 29 y.o. male presenting on 11/28/2019 for [place] (New =- DR Rickey Primus, [place])   Pt presents today to establish care with new PCP and for annual physical exam. Pt was seeing Dr. Nadara Mustard who is only practicing one day a week. Pt wants to establish with a provider that is available more often. Pt states he did have a narcotic dependence issue. States he has been sober for 5 years. States he has been doing very well. No relapses. He denies any specific concerns or complaints. States his wife is about to have a baby and he wanted to get his immunizations updated. He did recently have COVID-19. States he has recovered well from this.     Relevant past medical, surgical, family, and social history reviewed and updated as indicated.  Allergies and medications reviewed and updated. Date reviewed: Chart in Epic.   Past Medical History:  Diagnosis Date  . ADHD (attention deficit hyperactivity disorder)    no current med.  . Claustrophobia   . Deviated nasal septum 08/2017  . History of drug abuse ([place])   . History of head injury    age 74 - no residual deficits  . Nasal turbinate hypertrophy 08/2017  . Sinus headache     Past Surgical History:  Procedure Laterality Date  . INGUINAL HERNIA REPAIR Right   . NASAL SEPTOPLASTY W/ TURBINOPLASTY Bilateral 09/18/2017   Procedure: NASAL SEPTOPLASTY WITH TURBINATE REDUCTION;  Surgeon: Izora Gala, MD;  Location: [facility];  Service: ENT;  Laterality: Bilateral;  . TONSILLECTOMY AND ADENOIDECTOMY    . TYMPANOSTOMY TUBE PLACEMENT Bilateral    x 5    Social History   Socioeconomic History  . Marital status: Married    Spouse name: destiny   . Number  of children: 0  . Years of education: Not on file  . Highest education level: Not on file  Occupational History    Comment: [employer]  Tobacco Use  . Smoking status: Former Research scientist (life sciences)  . Smokeless tobacco: Current User    Types: Chew  . Tobacco comment: vapes 3-4 x/week  Substance and Sexual Activity  . Alcohol use: Yes    Comment: 1 x/week  . Drug use: No  . Sexual activity: Not on file  Other Topics Concern  . Not on file  Social History Narrative   1 step-son    Social Determinants of Health   Financial Resource Strain:   . Difficulty of Paying Living Expenses: Not on file  Food Insecurity:   . Worried About Charity fundraiser in the Last Year: Not on file  . Ran Out of Food in the Last Year: Not on file  Transportation Needs:   . Lack of Transportation (Medical): Not on file  . Lack of Transportation (Non-Medical): Not on file  Physical Activity:   . Days of Exercise per Week: Not on file  . Minutes of Exercise per Session: Not on file  Stress:   . Feeling of Stress : Not on file  Social Connections:   . Frequency of Communication with Friends and Family: Not on file  . Frequency of Social Gatherings with Friends and  Family: Not on file  . Attends Religious Services: Not on file  . Active Member of Clubs or Organizations: Not on file  . Attends Archivist Meetings: Not on file  . Marital Status: Not on file  Intimate Partner Violence:   . Fear of Current or Ex-Partner: Not on file  . Emotionally Abused: Not on file  . Physically Abused: Not on file  . Sexually Abused: Not on file    Outpatient Encounter Medications as of 11/28/2019  Medication Sig  . [DISCONTINUED] cephALEXin (KEFLEX) 500 MG capsule Take 1 capsule (500 mg total) 3 (three) times daily by mouth.  . [DISCONTINUED] cetirizine (ZYRTEC) 10 MG tablet Take 10 mg daily by mouth.  . [DISCONTINUED] HYDROcodone-acetaminophen (NORCO) 7.5-325 MG tablet Take 1 tablet every 6 (six) hours as needed by  mouth for moderate pain.  . [DISCONTINUED] promethazine (PHENERGAN) 25 MG suppository Place 1 suppository (25 mg total) every 6 (six) hours as needed rectally for nausea or vomiting.   No facility-administered encounter medications on file as of 11/28/2019.    No Known Allergies  Review of Systems  Constitutional: Negative for activity change, appetite change, chills, diaphoresis, fatigue, fever and unexpected weight change.  HENT:       Inability to smell  Eyes: Negative.  Negative for photophobia and visual disturbance.  Respiratory: Negative for cough, chest tightness and shortness of breath.   Cardiovascular: Negative for chest pain, palpitations and leg swelling.  Gastrointestinal: Negative for abdominal pain, blood in stool, constipation, diarrhea, nausea and vomiting.  Endocrine: Negative.  Negative for cold intolerance, heat intolerance, polydipsia, polyphagia and polyuria.  Genitourinary: Negative for decreased urine volume, difficulty urinating, dysuria, flank pain, frequency and urgency.  Musculoskeletal: Negative for arthralgias and myalgias.  Skin: Negative.   Allergic/Immunologic: Negative.   Neurological: Negative for dizziness, tremors, seizures, syncope, facial asymmetry, speech difficulty, weakness, light-headedness, numbness and headaches.  Hematological: Negative.   Psychiatric/Behavioral: Negative for agitation, behavioral problems, confusion, decreased concentration, dysphoric mood, hallucinations, self-injury, sleep disturbance and suicidal ideas. The patient is not nervous/anxious and is not hyperactive.   All other systems reviewed and are negative.       Objective:  BP 110/65   Pulse 90   Temp 99 F (37.2 C)   Resp 20   Ht 6' 2"  (1.88 m)   Wt (!) 342 lb (155.1 kg)   SpO2 98%   BMI 43.91 kg/m    Wt Readings from Last 3 Encounters:  11/28/19 (!) 342 lb (155.1 kg)  09/18/17 (!) 358 lb (162.4 kg)  05/30/13 (!) 323 lb (146.5 kg)    Physical  Exam Vitals and nursing note reviewed.  Constitutional:      General: He is not in acute distress.    Appearance: Normal appearance. He is well-developed and well-groomed. He is morbidly obese. He is not ill-appearing, toxic-appearing or diaphoretic.  HENT:     Head: Normocephalic and atraumatic.     Jaw: There is normal jaw occlusion.     Right Ear: Hearing, tympanic membrane, ear canal and external ear normal.     Left Ear: Hearing, tympanic membrane, ear canal and external ear normal.     Nose: Nose normal.     Mouth/Throat:     Lips: Pink.     Mouth: Mucous membranes are moist.     Pharynx: Oropharynx is clear. Uvula midline.  Eyes:     General: Lids are normal.     Extraocular Movements: Extraocular movements intact.  Conjunctiva/sclera: Conjunctivae normal.     Pupils: Pupils are equal, round, and reactive to light.  Neck:     Thyroid: No thyroid mass, thyromegaly or thyroid tenderness.     Vascular: No carotid bruit or JVD.     Trachea: Trachea and phonation normal.  Cardiovascular:     Rate and Rhythm: Normal rate and regular rhythm.     Chest Wall: PMI is not displaced.     Pulses: Normal pulses.     Heart sounds: Normal heart sounds. No murmur. No friction rub. No gallop.   Pulmonary:     Effort: Pulmonary effort is normal. No respiratory distress.     Breath sounds: Normal breath sounds. No wheezing.  Abdominal:     General: Bowel sounds are normal. There is no distension or abdominal bruit.     Palpations: Abdomen is soft. There is no hepatomegaly or splenomegaly.     Tenderness: There is no abdominal tenderness. There is no right CVA tenderness or left CVA tenderness.     Hernia: No hernia is present.  Musculoskeletal:        General: Normal range of motion.     Cervical back: Normal range of motion and neck supple.     Right lower leg: No edema.     Left lower leg: No edema.  Lymphadenopathy:     Cervical: No cervical adenopathy.  Skin:    General: Skin  is warm and dry.     Capillary Refill: Capillary refill takes less than 2 seconds.     Coloration: Skin is not cyanotic, jaundiced or pale.     Findings: No rash.  Neurological:     General: No focal deficit present.     Mental Status: He is alert and oriented to person, place, and time.     Cranial Nerves: Cranial nerves are intact. No cranial nerve deficit.     Sensory: Sensation is intact. No sensory deficit.     Motor: Motor function is intact. No weakness.     Coordination: Coordination is intact. Coordination normal.     Gait: Gait is intact. Gait normal.     Deep Tendon Reflexes: Reflexes are normal and symmetric. Reflexes normal.  Psychiatric:        Attention and Perception: Attention and perception normal.        Mood and Affect: Mood and affect normal.        Speech: Speech normal.        Behavior: Behavior normal. Behavior is cooperative.        Thought Content: Thought content normal.        Cognition and Memory: Cognition and memory normal.        Judgment: Judgment normal.     Results for orders placed or performed in visit on 05/30/13  TSH  Result Value Ref Range   TSH 1.08 0.35 - 5.50 uIU/mL  Lipid panel  Result Value Ref Range   Cholesterol 136 0 - 200 mg/dL   Triglycerides 78.0 0.0 - 149.0 mg/dL   HDL 32.80 (L) >39.00 mg/dL   VLDL 15.6 0.0 - 40.0 mg/dL   LDL Cholesterol 88 0 - 99 mg/dL   Total CHOL/HDL Ratio 4   Comprehensive metabolic panel  Result Value Ref Range   Sodium 139 135 - 145 mEq/L   Potassium 4.3 3.5 - 5.1 mEq/L   Chloride 107 96 - 112 mEq/L   CO2 26 19 - 32 mEq/L   Glucose, Bld 76 70 -  99 mg/dL   BUN 18 6 - 23 mg/dL   Creatinine, Ser 1.0 0.4 - 1.5 mg/dL   Total Bilirubin 0.7 0.3 - 1.2 mg/dL   Alkaline Phosphatase 48 39 - 117 U/L   AST 786 (H) 0 - 37 U/L   ALT 230 (H) 0 - 53 U/L   Total Protein 7.0 6.0 - 8.3 g/dL   Albumin 4.2 3.5 - 5.2 g/dL   Calcium 9.2 8.4 - 10.5 mg/dL   GFR 105.93 >60.00 mL/min  CBC with Differential  Result  Value Ref Range   WBC 8.0 4.5 - 10.5 K/uL   RBC 5.20 4.22 - 5.81 Mil/uL   Hemoglobin 15.8 13.0 - 17.0 g/dL   HCT 47.3 39.0 - 52.0 %   MCV 90.9 78.0 - 100.0 fl   MCHC 33.5 30.0 - 36.0 g/dL   RDW 12.3 11.5 - 14.6 %   Platelets 220.0 150.0 - 400.0 K/uL   Neutrophils Relative % 69.6 43.0 - 77.0 %   Lymphocytes Relative 21.8 12.0 - 46.0 %   Monocytes Relative 7.7 3.0 - 12.0 %   Eosinophils Relative 0.4 0.0 - 5.0 %   Basophils Relative 0.5 0.0 - 3.0 %   Neutro Abs 5.6 1.4 - 7.7 K/uL   Lymphs Abs 1.7 0.7 - 4.0 K/uL   Monocytes Absolute 0.6 0.1 - 1.0 K/uL   Eosinophils Absolute 0.0 0.0 - 0.7 K/uL   Basophils Absolute 0.0 0.0 - 0.1 K/uL       Pertinent labs & imaging results that were available during my care of the patient were reviewed by me and considered in my medical decision making.  Assessment & Plan:  Dutch was seen today for establish care.  Diagnoses and all orders for this visit:  Annual physical exam Health maintenance discussed. Labs pending. Diet and exercise encouraged.  -     CMP14+EGFR -     CBC with Differential/Platelet -     Lipid panel -     Thyroid Panel With TSH  Morbid obesity ([place]) Diet and exercise encouraged. Labs pending.  -     CMP14+EGFR -     CBC with Differential/Platelet -     Lipid panel -     Thyroid Panel With TSH  Tdap and influenza vaccinations given today.   Continue all other maintenance medications.  Follow up plan: Return in about 1 year (around 11/27/2020), or if symptoms worsen or fail to improve.  Continue healthy lifestyle choices, including diet (rich in fruits, vegetables, and lean proteins, and low in salt and simple carbohydrates) and exercise (at least 30 minutes of moderate physical activity daily).  Educational handout given for health maintenance  The above assessment and management plan was discussed with the patient. The patient verbalized understanding of and has agreed to the management plan. Patient is aware to  call the clinic if they develop any new symptoms or if symptoms persist or worsen. Patient is aware when to return to the clinic for a follow-up visit. Patient educated on when it is appropriate to go to the emergency department.   Monia Pouch, FNP-C [facility] [phone]

## 2019-11-29 LAB — CBC WITH DIFFERENTIAL/PLATELET
Basophils Absolute: 0 10*3/uL (ref 0.0–0.2)
Basos: 0 %
EOS (ABSOLUTE): 0.1 10*3/uL (ref 0.0–0.4)
Eos: 1 %
Hematocrit: 43.9 % (ref 37.5–51.0)
Hemoglobin: 14.8 g/dL (ref 13.0–17.7)
Immature Grans (Abs): 0 10*3/uL (ref 0.0–0.1)
Immature Granulocytes: 0 %
Lymphocytes Absolute: 2.1 10*3/uL (ref 0.7–3.1)
Lymphs: 28 %
MCH: 29.9 pg (ref 26.6–33.0)
MCHC: 33.7 g/dL (ref 31.5–35.7)
MCV: 89 fL (ref 79–97)
Monocytes Absolute: 0.6 10*3/uL (ref 0.1–0.9)
Monocytes: 8 %
Neutrophils Absolute: 4.6 10*3/uL (ref 1.4–7.0)
Neutrophils: 63 %
Platelets: 239 10*3/uL (ref 150–450)
RBC: 4.95 x10E6/uL (ref 4.14–5.80)
RDW: 12.1 % (ref 11.6–15.4)
WBC: 7.4 10*3/uL (ref 3.4–10.8)

## 2019-11-29 LAB — THYROID PANEL WITH TSH
Free Thyroxine Index: 2.8 (ref 1.2–4.9)
T3 Uptake Ratio: 30 % (ref 24–39)
T4, Total: 9.2 ug/dL (ref 4.5–12.0)
TSH: 0.981 u[IU]/mL (ref 0.450–4.500)

## 2019-11-29 LAB — CMP14+EGFR
ALT: 23 IU/L (ref 0–44)
AST: 22 IU/L (ref 0–40)
Albumin/Globulin Ratio: 2.3 — ABNORMAL HIGH (ref 1.2–2.2)
Albumin: 4.5 g/dL (ref 4.1–5.2)
Alkaline Phosphatase: 61 IU/L (ref 39–117)
BUN/Creatinine Ratio: 15 (ref 9–20)
BUN: 16 mg/dL (ref 6–20)
Bilirubin Total: 0.4 mg/dL (ref 0.0–1.2)
CO2: 25 mmol/L (ref 20–29)
Calcium: 9.2 mg/dL (ref 8.7–10.2)
Chloride: 104 mmol/L (ref 96–106)
Creatinine, Ser: 1.04 mg/dL (ref 0.76–1.27)
GFR calc Af Amer: 113 mL/min/{1.73_m2} (ref >59)
GFR calc non Af Amer: 98 mL/min/{1.73_m2} (ref >59)
Globulin, Total: 2 g/dL (ref 1.5–4.5)
Glucose: 92 mg/dL (ref 65–99)
Potassium: 4.1 mmol/L (ref 3.5–5.2)
Sodium: 140 mmol/L (ref 134–144)
Total Protein: 6.5 g/dL (ref 6.0–8.5)

## 2019-11-29 LAB — LIPID PANEL
Chol/HDL Ratio: 3.8 ratio (ref 0.0–5.0)
Cholesterol, Total: 144 mg/dL (ref 100–199)
HDL: 38 mg/dL — ABNORMAL LOW (ref >39)
LDL Chol Calc (NIH): 84 mg/dL (ref 0–99)
Triglycerides: 120 mg/dL (ref 0–149)
VLDL Cholesterol Cal: 22 mg/dL (ref 5–40)

## 2020-01-01 DIAGNOSIS — M898X7 Other specified disorders of bone, ankle and foot: Secondary | ICD-10-CM | POA: Diagnosis not present

## 2020-01-01 DIAGNOSIS — Z6835 Body mass index (BMI) 35.0-35.9, adult: Secondary | ICD-10-CM | POA: Diagnosis not present

## 2020-01-23 DIAGNOSIS — Z3009 Encounter for other general counseling and advice on contraception: Secondary | ICD-10-CM | POA: Diagnosis not present

## 2020-03-12 DIAGNOSIS — Z302 Encounter for sterilization: Secondary | ICD-10-CM | POA: Diagnosis not present

## 2021-02-22 DIAGNOSIS — M9903 Segmental and somatic dysfunction of lumbar region: Secondary | ICD-10-CM | POA: Diagnosis not present

## 2021-02-22 DIAGNOSIS — M62830 Muscle spasm of back: Secondary | ICD-10-CM | POA: Diagnosis not present

## 2021-02-22 DIAGNOSIS — M9904 Segmental and somatic dysfunction of sacral region: Secondary | ICD-10-CM | POA: Diagnosis not present

## 2021-02-22 DIAGNOSIS — M9905 Segmental and somatic dysfunction of pelvic region: Secondary | ICD-10-CM | POA: Diagnosis not present

## 2021-02-24 DIAGNOSIS — M9903 Segmental and somatic dysfunction of lumbar region: Secondary | ICD-10-CM | POA: Diagnosis not present

## 2021-02-24 DIAGNOSIS — M9904 Segmental and somatic dysfunction of sacral region: Secondary | ICD-10-CM | POA: Diagnosis not present

## 2021-02-24 DIAGNOSIS — M62830 Muscle spasm of back: Secondary | ICD-10-CM | POA: Diagnosis not present

## 2021-02-24 DIAGNOSIS — M9905 Segmental and somatic dysfunction of pelvic region: Secondary | ICD-10-CM | POA: Diagnosis not present

## 2021-02-25 ENCOUNTER — Ambulatory Visit: Payer: BC Managed Care – PPO | Admitting: Family Medicine

## 2021-02-25 ENCOUNTER — Other Ambulatory Visit: Payer: Self-pay

## 2021-02-25 ENCOUNTER — Encounter: Payer: Self-pay | Admitting: Family Medicine

## 2021-02-25 VITALS — BP 131/81 | HR 94 | Temp 98.2°F | Ht 74.0 in | Wt 321.2 lb

## 2021-02-25 DIAGNOSIS — R030 Elevated blood-pressure reading, without diagnosis of hypertension: Secondary | ICD-10-CM | POA: Diagnosis not present

## 2021-02-25 DIAGNOSIS — M9903 Segmental and somatic dysfunction of lumbar region: Secondary | ICD-10-CM | POA: Diagnosis not present

## 2021-02-25 DIAGNOSIS — M62830 Muscle spasm of back: Secondary | ICD-10-CM | POA: Diagnosis not present

## 2021-02-25 DIAGNOSIS — M9905 Segmental and somatic dysfunction of pelvic region: Secondary | ICD-10-CM | POA: Diagnosis not present

## 2021-02-25 DIAGNOSIS — M9904 Segmental and somatic dysfunction of sacral region: Secondary | ICD-10-CM | POA: Diagnosis not present

## 2021-02-25 NOTE — Progress Notes (Signed)
Acute Office Visit  Subjective:    Patient ID: Ricardo Curry, male    DOB: Jul 07, 1991, 30 y.o.   MRN: [id number]  Chief Complaint  Patient presents with  . Hypertension    HPI Patient is in today for elevated BP reading. He has been to the chiropracter for the last 3 days and his BP has been elevated there. He did note that they were using a regular size cuff. He has never been diagnosis with HTN before. He recently had his DOT physical and his BP was normal. He denies chest pain, shortness of breath, edema, dizziness, palpitations, fatigue, or headaches. He has recently lost 40 lbs. He is working on losing 40 more.   Past Medical History:  Diagnosis Date  . ADHD (attention deficit hyperactivity disorder)    no current med.  . Claustrophobia   . Deviated nasal septum 08/2017  . History of drug abuse ([facility])   . History of head injury    age 77 - no residual deficits  . Nasal turbinate hypertrophy 08/2017  . Sinus headache     Past Surgical History:  Procedure Laterality Date  . INGUINAL HERNIA REPAIR Right   . NASAL SEPTOPLASTY W/ TURBINOPLASTY Bilateral 09/18/2017   Procedure: NASAL SEPTOPLASTY WITH TURBINATE REDUCTION;  Surgeon: Serena Colonel, MD;  Location: [facility];  Service: ENT;  Laterality: Bilateral;  . TONSILLECTOMY AND ADENOIDECTOMY    . TYMPANOSTOMY TUBE PLACEMENT Bilateral    x 5    Family History  Problem Relation Age of Onset  . ADD / ADHD Mother   . ADD / ADHD Father   . Cancer Father        skin / melanoma - groin area   . Diabetes Maternal Grandmother   . Diabetes Maternal Grandfather     Social History   Socioeconomic History  . Marital status: Married    Spouse name: destiny   . Number of children: 0  . Years of education: Not on file  . Highest education level: Not on file  Occupational History    Comment: [employer]  Tobacco Use  . Smoking status: Former Games developer  . Smokeless tobacco: Current User    Types: Chew  .  Tobacco comment: vapes 3-4 x/week  Vaping Use  . Vaping Use: Never used  Substance and Sexual Activity  . Alcohol use: Yes    Comment: 1 x/week  . Drug use: No  . Sexual activity: Not on file  Other Topics Concern  . Not on file  Social History Narrative   1 step-son    Social Determinants of Health   Financial Resource Strain: Not on file  Food Insecurity: Not on file  Transportation Needs: Not on file  Physical Activity: Not on file  Stress: Not on file  Social Connections: Not on file  Intimate Partner Violence: Not on file    No outpatient medications prior to visit.   No facility-administered medications prior to visit.    No Known Allergies  Review of Systems As per HPI.     Objective:    Physical Exam Vitals and nursing note reviewed.  Constitutional:      General: He is not in acute distress.    Appearance: He is not ill-appearing, toxic-appearing or diaphoretic.  Cardiovascular:     Rate and Rhythm: Normal rate and regular rhythm.     Heart sounds: Normal heart sounds. No murmur heard.   Pulmonary:     Effort: Pulmonary  effort is normal. No respiratory distress.     Breath sounds: Normal breath sounds.  Musculoskeletal:     Right lower leg: No edema.     Left lower leg: No edema.  Skin:    General: Skin is dry.  Neurological:     General: No focal deficit present.     Mental Status: He is alert and oriented to person, place, and time.  Psychiatric:        Mood and Affect: Mood normal.        Behavior: Behavior normal.     BP 131/81   Pulse 94   Temp 98.2 F (36.8 C) (Temporal)   Ht 6\' 2"  (1.88 m)   Wt (!) 321 lb 4 oz (145.7 kg)   BMI 41.25 kg/m  Wt Readings from Last 3 Encounters:  02/25/21 (!) 321 lb 4 oz (145.7 kg)  11/28/19 (!) 342 lb (155.1 kg)  09/18/17 (!) 358 lb (162.4 kg)    There are no preventive care reminders to display for this patient.  There are no preventive care reminders to display for this patient.   Lab  Results  Component Value Date   TSH 0.981 11/28/2019   Lab Results  Component Value Date   WBC 7.4 11/28/2019   HGB 14.8 11/28/2019   HCT 43.9 11/28/2019   MCV 89 11/28/2019   PLT 239 11/28/2019   Lab Results  Component Value Date   NA 140 11/28/2019   K 4.1 11/28/2019   CO2 25 11/28/2019   GLUCOSE 92 11/28/2019   BUN 16 11/28/2019   CREATININE 1.04 11/28/2019   BILITOT 0.4 11/28/2019   ALKPHOS 61 11/28/2019   AST 22 11/28/2019   ALT 23 11/28/2019   PROT 6.5 11/28/2019   ALBUMIN 4.5 11/28/2019   CALCIUM 9.2 11/28/2019   GFR 105.93 05/30/2013   Lab Results  Component Value Date   CHOL 144 11/28/2019   Lab Results  Component Value Date   HDL 38 (L) 11/28/2019   Lab Results  Component Value Date   LDLCALC 84 11/28/2019   Lab Results  Component Value Date   TRIG 120 11/28/2019   Lab Results  Component Value Date   CHOLHDL 3.8 11/28/2019   No results found for: HGBA1C     Assessment & Plan:   Siris was seen today for hypertension.  Diagnoses and all orders for this visit:  Blood pressure elevated without history of HTN BP at goal today in office. Discussed that elevated reading at chiropractor are likely due to the incorrect size cuff. Denies symptoms. Declined lab work today.   Morbid obesity ([facility]) Recently lost 40 pounds. Diet and exercise. Handout given. Declined labs.   Return in about 3 months (around 05/27/2021) for physical with labs.  The patient indicates understanding of these issues and agrees with the plan.   05/29/2021, FNP

## 2021-02-25 NOTE — Patient Instructions (Signed)
Obesity, Adult Obesity is the condition of having too much total body fat. Being overweight or obese means that your weight is greater than what is considered healthy for your body size. Obesity is determined by a measurement called BMI. BMI is an estimate of body fat and is calculated from height and weight. For adults, a BMI of 30 or higher is considered obese. Obesity can lead to other health concerns and major illnesses, including:  Stroke.  Coronary artery disease (CAD).  Type 2 diabetes.  Some types of cancer, including cancers of the colon, breast, uterus, and gallbladder.  Osteoarthritis.  High blood pressure (hypertension).  High cholesterol.  Sleep apnea.  Gallbladder stones.  Infertility problems. What are the causes? Common causes of this condition include:  Eating daily meals that are high in calories, sugar, and fat.  Being born with genes that may make you more likely to become obese.  Having a medical condition that causes obesity, including: ? Hypothyroidism. ? Polycystic ovarian syndrome (PCOS). ? Binge-eating disorder. ? Cushing syndrome.  Taking certain medicines, such as steroids, antidepressants, and seizure medicines.  Not being physically active (sedentary lifestyle).  Not getting enough sleep.  Drinking high amounts of sugar-sweetened beverages, such as soft drinks. What increases the risk? The following factors may make you more likely to develop this condition:  Having a family history of obesity.  Being a woman of African American descent.  Being a man of Hispanic descent.  Living in an area with limited access to: ? Parks, recreation centers, or sidewalks. ? Healthy food choices, such as grocery stores and farmers' markets. What are the signs or symptoms? The main sign of this condition is having too much body fat. How is this diagnosed? This condition is diagnosed based on:  Your BMI. If you are an adult with a BMI of 30 or  higher, you are considered obese.  Your waist circumference. This measures the distance around your waistline.  Your skinfold thickness. Your health care provider may gently pinch a fold of your skin and measure it. You may have other tests to check for underlying conditions. How is this treated? Treatment for this condition often includes changing your lifestyle. Treatment may include some or all of the following:  Dietary changes. This may include developing a healthy meal plan.  Regular physical activity. This may include activity that causes your heart to beat faster (aerobic exercise) and strength training. Work with your health care provider to design an exercise program that works for you.  Medicine to help you lose weight if you are unable to lose 1 pound a week after 6 weeks of healthy eating and more physical activity.  Treating conditions that cause the obesity (underlying conditions).  Surgery. Surgical options may include gastric banding and gastric bypass. Surgery may be done if: ? Other treatments have not helped to improve your condition. ? You have a BMI of 40 or higher. ? You have life-threatening health problems related to obesity. Follow these instructions at home: Eating and drinking  Follow recommendations from your health care provider about what you eat and drink. Your health care provider may advise you to: ? Limit fast food, sweets, and processed snack foods. ? Choose low-fat options, such as low-fat milk instead of whole milk. ? Eat 5 or more servings of fruits or vegetables every day. ? Eat at home more often. This gives you more control over what you eat. ? Choose healthy foods when you eat out. ? Learn   to read food labels. This will help you understand how much food is considered 1 serving. ? Learn what a healthy serving size is. ? Keep low-fat snacks available. ? Limit sugary drinks, such as soda, fruit juice, sweetened iced tea, and flavored  milk.  Drink enough water to keep your urine pale yellow.  Do not follow a fad diet. Fad diets can be unhealthy and even dangerous.   Physical activity  Exercise regularly, as told by your health care provider. ? Most adults should get up to 150 minutes of moderate-intensity exercise every week. ? Ask your health care provider what types of exercise are safe for you and how often you should exercise.  Warm up and stretch before being active.  Cool down and stretch after being active.  Rest between periods of activity. Lifestyle  Work with your health care provider and a dietitian to set a weight-loss goal that is healthy and reasonable for you.  Limit your screen time.  Find ways to reward yourself that do not involve food.  Do not drink alcohol if: ? Your health care provider tells you not to drink. ? You are pregnant, may be pregnant, or are planning to become pregnant.  If you drink alcohol: ? Limit how much you use to:  0-1 drink a day for women.  0-2 drinks a day for men. ? Be aware of how much alcohol is in your drink. In the [place] one drink equals one 12 oz bottle of beer (355 mL), one 5 oz glass of wine (148 mL), or one 1 oz glass of hard liquor (44 mL). General instructions  Keep a weight-loss journal to keep track of the food you eat and how much exercise you get.  Take over-the-counter and prescription medicines only as told by your health care provider.  Take vitamins and supplements only as told by your health care provider.  Consider joining a support group. Your health care provider may be able to recommend a support group.  Keep all follow-up visits as told by your health care provider. This is important. Contact a health care provider if:  You are unable to meet your weight loss goal after 6 weeks of dietary and lifestyle changes. Get help right away if you are having:  Trouble breathing.  Suicidal thoughts or behaviors. Summary  Obesity is  the condition of having too much total body fat.  Being overweight or obese means that your weight is greater than what is considered healthy for your body size.  Work with your health care provider and a dietitian to set a weight-loss goal that is healthy and reasonable for you.  Exercise regularly, as told by your health care provider. Ask your health care provider what types of exercise are safe for you and how often you should exercise. This information is not intended to replace advice given to you by your health care provider. Make sure you discuss any questions you have with your health care provider. Document Revised: 06/21/2018 Document Reviewed: 06/21/2018 Elsevier Patient Education  2021 [employer]  

## 2021-03-01 DIAGNOSIS — M9905 Segmental and somatic dysfunction of pelvic region: Secondary | ICD-10-CM | POA: Diagnosis not present

## 2021-03-01 DIAGNOSIS — M9903 Segmental and somatic dysfunction of lumbar region: Secondary | ICD-10-CM | POA: Diagnosis not present

## 2021-03-01 DIAGNOSIS — M9904 Segmental and somatic dysfunction of sacral region: Secondary | ICD-10-CM | POA: Diagnosis not present

## 2021-03-01 DIAGNOSIS — M62830 Muscle spasm of back: Secondary | ICD-10-CM | POA: Diagnosis not present

## 2021-05-31 ENCOUNTER — Encounter: Payer: BC Managed Care – PPO | Admitting: Family Medicine

## 2021-06-03 ENCOUNTER — Encounter: Payer: Self-pay | Admitting: Family Medicine

## 2022-05-23 DIAGNOSIS — M9903 Segmental and somatic dysfunction of lumbar region: Secondary | ICD-10-CM | POA: Diagnosis not present

## 2022-05-23 DIAGNOSIS — M9905 Segmental and somatic dysfunction of pelvic region: Secondary | ICD-10-CM | POA: Diagnosis not present

## 2022-05-23 DIAGNOSIS — M62830 Muscle spasm of back: Secondary | ICD-10-CM | POA: Diagnosis not present

## 2022-05-23 DIAGNOSIS — M9904 Segmental and somatic dysfunction of sacral region: Secondary | ICD-10-CM | POA: Diagnosis not present

## 2022-05-24 DIAGNOSIS — M9904 Segmental and somatic dysfunction of sacral region: Secondary | ICD-10-CM | POA: Diagnosis not present

## 2022-05-24 DIAGNOSIS — M9903 Segmental and somatic dysfunction of lumbar region: Secondary | ICD-10-CM | POA: Diagnosis not present

## 2022-05-24 DIAGNOSIS — M9905 Segmental and somatic dysfunction of pelvic region: Secondary | ICD-10-CM | POA: Diagnosis not present

## 2022-05-24 DIAGNOSIS — M62830 Muscle spasm of back: Secondary | ICD-10-CM | POA: Diagnosis not present

## 2022-05-25 DIAGNOSIS — M9905 Segmental and somatic dysfunction of pelvic region: Secondary | ICD-10-CM | POA: Diagnosis not present

## 2022-05-25 DIAGNOSIS — M9903 Segmental and somatic dysfunction of lumbar region: Secondary | ICD-10-CM | POA: Diagnosis not present

## 2022-05-25 DIAGNOSIS — M9904 Segmental and somatic dysfunction of sacral region: Secondary | ICD-10-CM | POA: Diagnosis not present

## 2022-05-25 DIAGNOSIS — M62830 Muscle spasm of back: Secondary | ICD-10-CM | POA: Diagnosis not present

## 2022-09-01 DIAGNOSIS — L918 Other hypertrophic disorders of the skin: Secondary | ICD-10-CM | POA: Diagnosis not present

## 2022-10-22 DIAGNOSIS — J029 Acute pharyngitis, unspecified: Secondary | ICD-10-CM | POA: Diagnosis not present
